# Patient Record
Sex: Female | Born: 1955 | Race: Black or African American | Hispanic: No | Marital: Single | Smoking: Smoker, current status unknown
Health system: Southern US, Community
[De-identification: ages and names within clinical notes are randomized; demographics above are authoritative.]

## PROBLEM LIST (undated history)

## (undated) DIAGNOSIS — D219 Benign neoplasm of connective and other soft tissue, unspecified: Secondary | ICD-10-CM

## (undated) DIAGNOSIS — J45909 Unspecified asthma, uncomplicated: Secondary | ICD-10-CM

## (undated) HISTORY — PX: ABDOMINAL HYSTERECTOMY: SHX81

## (undated) HISTORY — DX: Unspecified asthma, uncomplicated: J45.909

## (undated) HISTORY — DX: Benign neoplasm of connective and other soft tissue, unspecified: D21.9

---

## 2013-03-14 ENCOUNTER — Other Ambulatory Visit (HOSPITAL_COMMUNITY)
Admission: RE | Admit: 2013-03-14 | Discharge: 2013-03-14 | Disposition: A | Discharge status: Home / Self Care | Payer: PRIVATE HEALTH INSURANCE | Source: Ambulatory Visit | Attending: Family Medicine | Admitting: Family Medicine

## 2013-03-14 ENCOUNTER — Encounter: Payer: Self-pay | Admitting: Family Medicine

## 2013-03-14 ENCOUNTER — Ambulatory Visit (INDEPENDENT_AMBULATORY_CARE_PROVIDER_SITE_OTHER): Payer: PRIVATE HEALTH INSURANCE | Admitting: Family Medicine

## 2013-03-14 VITALS — BP 122/80 | HR 55 | Temp 98.4°F | Ht 63.75 in | Wt 150.6 lb

## 2013-03-14 DIAGNOSIS — R220 Localized swelling, mass and lump, head: Secondary | ICD-10-CM

## 2013-03-14 DIAGNOSIS — Z23 Encounter for immunization: Secondary | ICD-10-CM

## 2013-03-14 DIAGNOSIS — Z0000 Encounter for general adult medical examination without abnormal findings: Secondary | ICD-10-CM

## 2013-03-14 DIAGNOSIS — Z01419 Encounter for gynecological examination (general) (routine) without abnormal findings: Secondary | ICD-10-CM | POA: Insufficient documentation

## 2013-03-14 DIAGNOSIS — E2839 Other primary ovarian failure: Secondary | ICD-10-CM

## 2013-03-14 DIAGNOSIS — Z124 Encounter for screening for malignant neoplasm of cervix: Secondary | ICD-10-CM

## 2013-03-14 DIAGNOSIS — Z1239 Encounter for other screening for malignant neoplasm of breast: Secondary | ICD-10-CM

## 2013-03-14 LAB — CBC WITH DIFFERENTIAL/PLATELET
Basophils Relative: 0.8 % (ref 0.0–3.0)
Eosinophils Absolute: 0.1 10*3/uL (ref 0.0–0.7)
Eosinophils Relative: 1.5 % (ref 0.0–5.0)
HCT: 37.6 % (ref 36.0–46.0)
Lymphs Abs: 1.7 10*3/uL (ref 0.7–4.0)
MCHC: 33.4 g/dL (ref 30.0–36.0)
MCV: 85.5 fl (ref 78.0–100.0)
Monocytes Absolute: 0.3 10*3/uL (ref 0.1–1.0)
Neutrophils Relative %: 53.1 % (ref 43.0–77.0)
Platelets: 238 10*3/uL (ref 150.0–400.0)
RBC: 4.4 Mil/uL (ref 3.87–5.11)
WBC: 4.6 10*3/uL (ref 4.5–10.5)

## 2013-03-14 LAB — LIPID PANEL
Cholesterol: 142 mg/dL (ref 0–200)
HDL: 48.2 mg/dL
LDL Cholesterol: 84 mg/dL (ref 0–99)
Total CHOL/HDL Ratio: 3
Triglycerides: 47 mg/dL (ref 0.0–149.0)
VLDL: 9.4 mg/dL (ref 0.0–40.0)

## 2013-03-14 LAB — BASIC METABOLIC PANEL
BUN: 12 mg/dL (ref 6–23)
CO2: 26 mEq/L (ref 19–32)
Chloride: 107 mEq/L (ref 96–112)
Creatinine, Ser: 1 mg/dL (ref 0.4–1.2)
Potassium: 3.3 mEq/L — ABNORMAL LOW (ref 3.5–5.1)

## 2013-03-14 LAB — HEPATIC FUNCTION PANEL
ALT: 20 U/L (ref 0–35)
Total Protein: 7.1 g/dL (ref 6.0–8.3)

## 2013-03-14 LAB — TSH: TSH: 1.49 u[IU]/mL (ref 0.35–5.50)

## 2013-03-14 NOTE — Progress Notes (Signed)
Subjective:     Erica Hill is a 57 y.o. female and is here for a comprehensive physical exam. The patient reports no problems.  History   Social History  . Marital Status: Single    Spouse Name: N/A    Number of Children: N/A  . Years of Education: N/A   Occupational History  . [employer]    Social History Main Topics  . Smoking status: Smoker, Current Status Unknown -- 0.10 packs/day    Types: Cigarettes  . Smokeless tobacco: Never Used     Comment: 1 cig a day  . Alcohol Use: Yes     Comment: weekens  . Drug Use: No  . Sexually Active: Not Currently -- Female partner(s)   Other Topics Concern  . Not on file   Social History Narrative   Exercise-- stretching otherwise no   No health maintenance topics applied.  The following portions of the patient's history were reviewed and updated as appropriate:  She  has a past medical history of Asthma and Fibroids. She  does not have a problem list on file. She  has past surgical history that includes Abdominal hysterectomy. Her family history includes Cancer in her maternal uncle; Diabetes in her brother and mother; High blood pressure in her brother and mother; Stroke in her mother; and Tuberculosis in her father. She  reports that she has been smoking Cigarettes.  She has been smoking about 0.10 packs per day. She has never used smokeless tobacco. She reports that  drinks alcohol. She reports that she does not use illicit drugs. She currently has no medications in their medication list. No current outpatient prescriptions on file prior to visit.   No current facility-administered medications on file prior to visit.   She has No Known Allergies..  Review of Systems Review of Systems  Constitutional: Negative for activity change, appetite change and fatigue.  HENT: Negative for hearing loss, congestion, tinnitus and ear discharge.  dentist--no Eyes: Negative for visual disturbance (see optho--due) Respiratory:  Negative for cough, chest tightness and shortness of breath.   Cardiovascular: Negative for chest pain, palpitations and leg swelling.  Gastrointestinal: Negative for abdominal pain, diarrhea, constipation and abdominal distention.  Genitourinary: Negative for urgency, frequency, decreased urine volume and difficulty urinating.  Musculoskeletal: Negative for back pain, arthralgias and gait problem.  Skin: Negative for color change, pallor and rash.  Neurological: Negative for dizziness, light-headedness, numbness and headaches.  Hematological: Negative for adenopathy. Does not bruise/bleed easily.  Psychiatric/Behavioral: Negative for suicidal ideas, confusion, sleep disturbance, self-injury, dysphoric mood, decreased concentration and agitation.       Objective:    BP 122/80  Pulse 55  Temp(Src) 98.4 F (36.9 C) (Oral)  Ht 5' 3.75" (1.619 m)  Wt 150 lb 9.6 oz (68.312 kg)  BMI 26.06 kg/m2  SpO2 95% General appearance: alert, cooperative, appears stated age and no distress Head: Normocephalic, without obvious abnormality, atraumatic Eyes: negative findings: lids and lashes normal, conjunctivae and sclerae normal and pupils equal, round, reactive to light and accomodation Ears: normal TM's and external ear canals both ears Nose: Nares normal. Septum midline. Mucosa normal. No drainage or sinus tenderness. Throat: normal findings: lips normal without lesions, buccal mucosa normal and oropharynx pink & moist without lesions or evidence of thrush and abnormal findings: dentition: poor Neck: no adenopathy, no carotid bruit, no JVD, supple, symmetrical, trachea midline and thyroid not enlarged, symmetric, no tenderness/mass/nodules Back: symmetric, no curvature. ROM normal. No CVA tenderness. Lungs: clear  to auscultation bilaterally Breasts: normal appearance, no masses or tenderness Heart: regular rate and rhythm, S1, S2 normal, no murmur, click, rub or gallop Abdomen: soft, non-tender;  bowel sounds normal; no masses,  no organomegaly Pelvic: external genitalia normal, no adnexal masses or tenderness, rectovaginal septum normal, uterus surgically absent and vagina normal without discharge Extremities: extremities normal, atraumatic, no cyanosis or edema Pulses: 2+ and symmetric Skin: Skin color, texture, turgor normal. No rashes or lesions Lymph nodes: subclavicular mass L --- hard , nontender, nonmobile Neurologic: Alert and oriented X 3, normal strength and tone. Normal symmetric reflexes. Normal coordination and gait Psych-- no depression, no anxiety      Assessment:    Healthy female exam.      Plan:    ghm utd Check labs See After Visit Summary for Counseling Recommendations  Colon ordered Bmd, mammo ordered

## 2013-03-14 NOTE — Addendum Note (Signed)
Addended by: Arnette Norris on: 03/14/2013 02:14 PM   Modules accepted: Orders

## 2013-03-14 NOTE — Patient Instructions (Addendum)
Preventive Care for Adults, Female A healthy lifestyle and preventive care can promote health and wellness. Preventive health guidelines for women include the following key practices.  A routine yearly physical is a good way to check with your caregiver about your health and preventive screening. It is a chance to share any concerns and updates on your health, and to receive a thorough exam.  Visit your dentist for a routine exam and preventive care every 6 months. Brush your teeth twice a day and floss once a day. Good oral hygiene prevents tooth decay and gum disease.  The frequency of eye exams is based on your age, health, family medical history, use of contact lenses, and other factors. Follow your caregiver's recommendations for frequency of eye exams.  Eat a healthy diet. Foods like vegetables, fruits, whole grains, low-fat dairy products, and lean protein foods contain the nutrients you need without too many calories. Decrease your intake of foods high in solid fats, added sugars, and salt. Eat the right amount of calories for you.Get information about a proper diet from your caregiver, if necessary.  Regular physical exercise is one of the most important things you can do for your health. Most adults should get at least 150 minutes of moderate-intensity exercise (any activity that increases your heart rate and causes you to sweat) each week. In addition, most adults need muscle-strengthening exercises on 2 or more days a week.  Maintain a healthy weight. The body mass index (BMI) is a screening tool to identify possible weight problems. It provides an estimate of body fat based on height and weight. Your caregiver can help determine your BMI, and can help you achieve or maintain a healthy weight.For adults 20 years and older:  A BMI below 18.5 is considered underweight.  A BMI of 18.5 to 24.9 is normal.  A BMI of 25 to 29.9 is considered overweight.  A BMI of 30 and above is  considered obese.  Maintain normal blood lipids and cholesterol levels by exercising and minimizing your intake of saturated fat. Eat a balanced diet with plenty of fruit and vegetables. Blood tests for lipids and cholesterol should begin at age 20 and be repeated every 5 years. If your lipid or cholesterol levels are high, you are over 50, or you are at high risk for heart disease, you may need your cholesterol levels checked more frequently.Ongoing high lipid and cholesterol levels should be treated with medicines if diet and exercise are not effective.  If you smoke, find out from your caregiver how to quit. If you do not use tobacco, do not start.  If you are pregnant, do not drink alcohol. If you are breastfeeding, be very cautious about drinking alcohol. If you are not pregnant and choose to drink alcohol, do not exceed 1 drink per day. One drink is considered to be 12 ounces (355 mL) of beer, 5 ounces (148 mL) of wine, or 1.5 ounces (44 mL) of liquor.  Avoid use of street drugs. Do not share needles with anyone. Ask for help if you need support or instructions about stopping the use of drugs.  High blood pressure causes heart disease and increases the risk of stroke. Your blood pressure should be checked at least every 1 to 2 years. Ongoing high blood pressure should be treated with medicines if weight loss and exercise are not effective.  If you are 55 to 55 years old, ask your caregiver if you should take aspirin to prevent strokes.  Diabetes   screening involves taking a blood sample to check your fasting blood sugar level. This should be done once every 3 years, after age 45, if you are within normal weight and without risk factors for diabetes. Testing should be considered at a younger age or be carried out more frequently if you are overweight and have at least 1 risk factor for diabetes.  Breast cancer screening is essential preventive care for women. You should practice "breast  self-awareness." This means understanding the normal appearance and feel of your breasts and may include breast self-examination. Any changes detected, no matter how small, should be reported to a caregiver. Women in their 20s and 30s should have a clinical breast exam (CBE) by a caregiver as part of a regular health exam every 1 to 3 years. After age 40, women should have a CBE every year. Starting at age 40, women should consider having a mammography (breast X-ray test) every year. Women who have a family history of breast cancer should talk to their caregiver about genetic screening. Women at a high risk of breast cancer should talk to their caregivers about having magnetic resonance imaging (MRI) and a mammography every year.  The Pap test is a screening test for cervical cancer. A Pap test can show cell changes on the cervix that might become cervical cancer if left untreated. A Pap test is a procedure in which cells are obtained and examined from the lower end of the uterus (cervix).  Women should have a Pap test starting at age 21.  Between ages 21 and 29, Pap tests should be repeated every 2 years.  Beginning at age 30, you should have a Pap test every 3 years as long as the past 3 Pap tests have been normal.  Some women have medical problems that increase the chance of getting cervical cancer. Talk to your caregiver about these problems. It is especially important to talk to your caregiver if a new problem develops soon after your last Pap test. In these cases, your caregiver may recommend more frequent screening and Pap tests.  The above recommendations are the same for women who have or have not gotten the vaccine for human papillomavirus (HPV).  If you had a hysterectomy for a problem that was not cancer or a condition that could lead to cancer, then you no longer need Pap tests. Even if you no longer need a Pap test, a regular exam is a good idea to make sure no other problems are  starting.  If you are between ages 65 and 70, and you have had normal Pap tests going back 10 years, you no longer need Pap tests. Even if you no longer need a Pap test, a regular exam is a good idea to make sure no other problems are starting.  If you have had past treatment for cervical cancer or a condition that could lead to cancer, you need Pap tests and screening for cancer for at least 20 years after your treatment.  If Pap tests have been discontinued, risk factors (such as a new sexual partner) need to be reassessed to determine if screening should be resumed.  The HPV test is an additional test that may be used for cervical cancer screening. The HPV test looks for the virus that can cause the cell changes on the cervix. The cells collected during the Pap test can be tested for HPV. The HPV test could be used to screen women aged 30 years and older, and should   be used in women of any age who have unclear Pap test results. After the age of 30, women should have HPV testing at the same frequency as a Pap test.  Colorectal cancer can be detected and often prevented. Most routine colorectal cancer screening begins at the age of 50 and continues through age 75. However, your caregiver may recommend screening at an earlier age if you have risk factors for colon cancer. On a yearly basis, your caregiver may provide home test kits to check for hidden blood in the stool. Use of a small camera at the end of a tube, to directly examine the colon (sigmoidoscopy or colonoscopy), can detect the earliest forms of colorectal cancer. Talk to your caregiver about this at age 50, when routine screening begins. Direct examination of the colon should be repeated every 5 to 10 years through age 75, unless early forms of pre-cancerous polyps or small growths are found.  Hepatitis C blood testing is recommended for all people born from 1945 through 1965 and any individual with known risks for hepatitis C.  Practice  safe sex. Use condoms and avoid high-risk sexual practices to reduce the spread of sexually transmitted infections (STIs). STIs include gonorrhea, chlamydia, syphilis, trichomonas, herpes, HPV, and human immunodeficiency virus (HIV). Herpes, HIV, and HPV are viral illnesses that have no cure. They can result in disability, cancer, and death. Sexually active women aged 25 and younger should be checked for chlamydia. Older women with new or multiple partners should also be tested for chlamydia. Testing for other STIs is recommended if you are sexually active and at increased risk.  Osteoporosis is a disease in which the bones lose minerals and strength with aging. This can result in serious bone fractures. The risk of osteoporosis can be identified using a bone density scan. Women ages 65 and over and women at risk for fractures or osteoporosis should discuss screening with their caregivers. Ask your caregiver whether you should take a calcium supplement or vitamin D to reduce the rate of osteoporosis.  Menopause can be associated with physical symptoms and risks. Hormone replacement therapy is available to decrease symptoms and risks. You should talk to your caregiver about whether hormone replacement therapy is right for you.  Use sunscreen with sun protection factor (SPF) of 30 or more. Apply sunscreen liberally and repeatedly throughout the day. You should seek shade when your shadow is shorter than you. Protect yourself by wearing long sleeves, pants, a wide-brimmed hat, and sunglasses year round, whenever you are outdoors.  Once a month, do a whole body skin exam, using a mirror to look at the skin on your back. Notify your caregiver of new moles, moles that have irregular borders, moles that are larger than a pencil eraser, or moles that have changed in shape or color.  Stay current with required immunizations.  Influenza. You need a dose every fall (or winter). The composition of the flu vaccine  changes each year, so being vaccinated once is not enough.  Pneumococcal polysaccharide. You need 1 to 2 doses if you smoke cigarettes or if you have certain chronic medical conditions. You need 1 dose at age 65 (or older) if you have never been vaccinated.  Tetanus, diphtheria, pertussis (Tdap, Td). Get 1 dose of Tdap vaccine if you are younger than age 65, are over 65 and have contact with an infant, are a healthcare worker, are pregnant, or simply want to be protected from whooping cough. After that, you need a Td   booster dose every 10 years. Consult your caregiver if you have not had at least 3 tetanus and diphtheria-containing shots sometime in your life or have a deep or dirty wound.  HPV. You need this vaccine if you are a woman age 26 or younger. The vaccine is given in 3 doses over 6 months.  Measles, mumps, rubella (MMR). You need at least 1 dose of MMR if you were born in 1957 or later. You may also need a second dose.  Meningococcal. If you are age 19 to 21 and a first-year college student living in a residence hall, or have one of several medical conditions, you need to get vaccinated against meningococcal disease. You may also need additional booster doses.  Zoster (shingles). If you are age 60 or older, you should get this vaccine.  Varicella (chickenpox). If you have never had chickenpox or you were vaccinated but received only 1 dose, talk to your caregiver to find out if you need this vaccine.  Hepatitis A. You need this vaccine if you have a specific risk factor for hepatitis A virus infection or you simply wish to be protected from this disease. The vaccine is usually given as 2 doses, 6 to 18 months apart.  Hepatitis B. You need this vaccine if you have a specific risk factor for hepatitis B virus infection or you simply wish to be protected from this disease. The vaccine is given in 3 doses, usually over 6 months. Preventive Services / Frequency Ages 19 to 39  Blood  pressure check.** / Every 1 to 2 years.  Lipid and cholesterol check.** / Every 5 years beginning at age 20.  Clinical breast exam.** / Every 3 years for women in their 20s and 30s.  Pap test.** / Every 2 years from ages 21 through 29. Every 3 years starting at age 30 through age 65 or 70 with a history of 3 consecutive normal Pap tests.  HPV screening.** / Every 3 years from ages 30 through ages 65 to 70 with a history of 3 consecutive normal Pap tests.  Hepatitis C blood test.** / For any individual with known risks for hepatitis C.  Skin self-exam. / Monthly.  Influenza immunization.** / Every year.  Pneumococcal polysaccharide immunization.** / 1 to 2 doses if you smoke cigarettes or if you have certain chronic medical conditions.  Tetanus, diphtheria, pertussis (Tdap, Td) immunization. / A one-time dose of Tdap vaccine. After that, you need a Td booster dose every 10 years.  HPV immunization. / 3 doses over 6 months, if you are 26 and younger.  Measles, mumps, rubella (MMR) immunization. / You need at least 1 dose of MMR if you were born in 1957 or later. You may also need a second dose.  Meningococcal immunization. / 1 dose if you are age 19 to 21 and a first-year college student living in a residence hall, or have one of several medical conditions, you need to get vaccinated against meningococcal disease. You may also need additional booster doses.  Varicella immunization.** / Consult your caregiver.  Hepatitis A immunization.** / Consult your caregiver. 2 doses, 6 to 18 months apart.  Hepatitis B immunization.** / Consult your caregiver. 3 doses usually over 6 months. Ages 40 to 64  Blood pressure check.** / Every 1 to 2 years.  Lipid and cholesterol check.** / Every 5 years beginning at age 20.  Clinical breast exam.** / Every year after age 40.  Mammogram.** / Every year beginning at age 40   and continuing for as long as you are in good health. Consult with your  caregiver.  Pap test.** / Every 3 years starting at age 30 through age 65 or 70 with a history of 3 consecutive normal Pap tests.  HPV screening.** / Every 3 years from ages 30 through ages 65 to 70 with a history of 3 consecutive normal Pap tests.  Fecal occult blood test (FOBT) of stool. / Every year beginning at age 50 and continuing until age 75. You may not need to do this test if you get a colonoscopy every 10 years.  Flexible sigmoidoscopy or colonoscopy.** / Every 5 years for a flexible sigmoidoscopy or every 10 years for a colonoscopy beginning at age 50 and continuing until age 75.  Hepatitis C blood test.** / For all people born from 1945 through 1965 and any individual with known risks for hepatitis C.  Skin self-exam. / Monthly.  Influenza immunization.** / Every year.  Pneumococcal polysaccharide immunization.** / 1 to 2 doses if you smoke cigarettes or if you have certain chronic medical conditions.  Tetanus, diphtheria, pertussis (Tdap, Td) immunization.** / A one-time dose of Tdap vaccine. After that, you need a Td booster dose every 10 years.  Measles, mumps, rubella (MMR) immunization. / You need at least 1 dose of MMR if you were born in 1957 or later. You may also need a second dose.  Varicella immunization.** / Consult your caregiver.  Meningococcal immunization.** / Consult your caregiver.  Hepatitis A immunization.** / Consult your caregiver. 2 doses, 6 to 18 months apart.  Hepatitis B immunization.** / Consult your caregiver. 3 doses, usually over 6 months. Ages 65 and over  Blood pressure check.** / Every 1 to 2 years.  Lipid and cholesterol check.** / Every 5 years beginning at age 20.  Clinical breast exam.** / Every year after age 40.  Mammogram.** / Every year beginning at age 40 and continuing for as long as you are in good health. Consult with your caregiver.  Pap test.** / Every 3 years starting at age 30 through age 65 or 70 with a 3  consecutive normal Pap tests. Testing can be stopped between 65 and 70 with 3 consecutive normal Pap tests and no abnormal Pap or HPV tests in the past 10 years.  HPV screening.** / Every 3 years from ages 30 through ages 65 or 70 with a history of 3 consecutive normal Pap tests. Testing can be stopped between 65 and 70 with 3 consecutive normal Pap tests and no abnormal Pap or HPV tests in the past 10 years.  Fecal occult blood test (FOBT) of stool. / Every year beginning at age 50 and continuing until age 75. You may not need to do this test if you get a colonoscopy every 10 years.  Flexible sigmoidoscopy or colonoscopy.** / Every 5 years for a flexible sigmoidoscopy or every 10 years for a colonoscopy beginning at age 50 and continuing until age 75.  Hepatitis C blood test.** / For all people born from 1945 through 1965 and any individual with known risks for hepatitis C.  Osteoporosis screening.** / A one-time screening for women ages 65 and over and women at risk for fractures or osteoporosis.  Skin self-exam. / Monthly.  Influenza immunization.** / Every year.  Pneumococcal polysaccharide immunization.** / 1 dose at age 65 (or older) if you have never been vaccinated.  Tetanus, diphtheria, pertussis (Tdap, Td) immunization. / A one-time dose of Tdap vaccine if you are over   65 and have contact with an infant, are a healthcare worker, or simply want to be protected from whooping cough. After that, you need a Td booster dose every 10 years.  Varicella immunization.** / Consult your caregiver.  Meningococcal immunization.** / Consult your caregiver.  Hepatitis A immunization.** / Consult your caregiver. 2 doses, 6 to 18 months apart.  Hepatitis B immunization.** / Check with your caregiver. 3 doses, usually over 6 months. ** Family history and personal history of risk and conditions may change your caregiver's recommendations. Document Released: 12/20/2001 Document Revised: 01/16/2012  Document Reviewed: 03/21/2011 [employer] Patient Information 2013 [employer].  

## 2013-03-14 NOTE — Assessment & Plan Note (Signed)
US ordered

## 2013-03-18 ENCOUNTER — Ambulatory Visit (HOSPITAL_BASED_OUTPATIENT_CLINIC_OR_DEPARTMENT_OTHER)
Admission: RE | Admit: 2013-03-18 | Discharge: 2013-03-18 | Disposition: A | Discharge status: Home / Self Care | Payer: PRIVATE HEALTH INSURANCE | Source: Ambulatory Visit | Attending: Family Medicine | Admitting: Family Medicine

## 2013-03-18 DIAGNOSIS — Z1231 Encounter for screening mammogram for malignant neoplasm of breast: Secondary | ICD-10-CM | POA: Insufficient documentation

## 2013-03-18 DIAGNOSIS — Z1239 Encounter for other screening for malignant neoplasm of breast: Secondary | ICD-10-CM

## 2013-03-18 DIAGNOSIS — E041 Nontoxic single thyroid nodule: Secondary | ICD-10-CM | POA: Insufficient documentation

## 2013-03-18 DIAGNOSIS — R220 Localized swelling, mass and lump, head: Secondary | ICD-10-CM

## 2013-03-18 DIAGNOSIS — R928 Other abnormal and inconclusive findings on diagnostic imaging of breast: Secondary | ICD-10-CM | POA: Insufficient documentation

## 2013-03-19 ENCOUNTER — Other Ambulatory Visit: Payer: Self-pay | Admitting: Family Medicine

## 2013-03-19 DIAGNOSIS — R928 Other abnormal and inconclusive findings on diagnostic imaging of breast: Secondary | ICD-10-CM

## 2013-03-19 LAB — POCT URINALYSIS DIPSTICK
Bilirubin, UA: NEGATIVE
Blood, UA: NEGATIVE
Glucose, UA: NEGATIVE
Ketones, UA: NEGATIVE
Spec Grav, UA: 1.015
pH, UA: 6.5

## 2013-03-20 ENCOUNTER — Other Ambulatory Visit: Payer: Self-pay | Admitting: General Practice

## 2013-03-20 ENCOUNTER — Telehealth: Payer: Self-pay | Admitting: General Practice

## 2013-03-20 MED ORDER — METRONIDAZOLE 500 MG PO TABS: 500.0000 mg | ORAL_TABLET | Freq: Two times a day (BID) | ORAL | Status: DC

## 2013-03-20 NOTE — Telephone Encounter (Signed)
Called pt to give her results of [place]. While on the phone Pt stated that she has another blister on her behind. Said you wanted her to let you know. Please advise.

## 2013-03-20 NOTE — Telephone Encounter (Signed)
Patient scheduled for Friday at 1015    KP

## 2013-03-20 NOTE — Telephone Encounter (Signed)
I actually wanted to see it next time she broke out

## 2013-03-22 ENCOUNTER — Encounter: Payer: Self-pay | Admitting: Family Medicine

## 2013-03-22 ENCOUNTER — Ambulatory Visit (INDEPENDENT_AMBULATORY_CARE_PROVIDER_SITE_OTHER): Payer: PRIVATE HEALTH INSURANCE | Admitting: Family Medicine

## 2013-03-22 VITALS — BP 138/86 | HR 57 | Wt 153.0 lb

## 2013-03-22 DIAGNOSIS — K644 Residual hemorrhoidal skin tags: Secondary | ICD-10-CM

## 2013-03-22 MED ORDER — HYDROCORTISONE ACE-PRAMOXINE 1-1 % RE FOAM: 1.0000 | Freq: Two times a day (BID) | RECTAL | Status: DC

## 2013-03-22 NOTE — Progress Notes (Signed)
  Subjective:    Patient ID: Erica Hill, female    DOB: 1956/08/04, 57 y.o.   MRN: [id number]  HPI Pt here c/o blister on rectum that developed since last visit.  No constipation, no diarrhea.  No long trips. No other symptoms   Review of Systems As above     Objective:   Physical Exam  BP 138/86  Pulse 57  Wt 153 lb (69.4 kg)  BMI 26.48 kg/m2  SpO2 97% General appearance: alert, cooperative, appears stated age and no distress Rectum-- + ext hemorrhoid , no bleeding      Assessment & Plan:

## 2013-03-22 NOTE — Assessment & Plan Note (Signed)
Proctofoam Sitz bath To GI if no improvement

## 2013-03-22 NOTE — Patient Instructions (Signed)

## 2013-03-29 ENCOUNTER — Other Ambulatory Visit: Payer: PRIVATE HEALTH INSURANCE

## 2013-04-11 ENCOUNTER — Ambulatory Visit
Admission: RE | Admit: 2013-04-11 | Discharge: 2013-04-11 | Disposition: A | Discharge status: Home / Self Care | Payer: PRIVATE HEALTH INSURANCE | Source: Ambulatory Visit | Attending: Family Medicine | Admitting: Family Medicine

## 2013-04-11 ENCOUNTER — Other Ambulatory Visit: Payer: Self-pay | Admitting: Family Medicine

## 2013-04-11 DIAGNOSIS — R928 Other abnormal and inconclusive findings on diagnostic imaging of breast: Secondary | ICD-10-CM

## 2013-04-15 ENCOUNTER — Ambulatory Visit
Admission: RE | Admit: 2013-04-15 | Discharge: 2013-04-15 | Disposition: A | Discharge status: Home / Self Care | Payer: PRIVATE HEALTH INSURANCE | Source: Ambulatory Visit | Attending: Family Medicine | Admitting: Family Medicine

## 2013-04-15 ENCOUNTER — Other Ambulatory Visit: Payer: Self-pay | Admitting: Family Medicine

## 2013-04-15 DIAGNOSIS — R928 Other abnormal and inconclusive findings on diagnostic imaging of breast: Secondary | ICD-10-CM

## 2013-04-15 DIAGNOSIS — E2839 Other primary ovarian failure: Secondary | ICD-10-CM

## 2013-04-25 ENCOUNTER — Inpatient Hospital Stay: Admission: RE | Admit: 2013-04-25 | Payer: PRIVATE HEALTH INSURANCE | Source: Ambulatory Visit

## 2013-04-30 ENCOUNTER — Inpatient Hospital Stay: Admission: RE | Admit: 2013-04-30 | Payer: PRIVATE HEALTH INSURANCE | Source: Ambulatory Visit

## 2013-08-07 ENCOUNTER — Encounter: Payer: Self-pay | Admitting: Family Medicine

## 2013-08-07 ENCOUNTER — Ambulatory Visit (INDEPENDENT_AMBULATORY_CARE_PROVIDER_SITE_OTHER): Payer: PRIVATE HEALTH INSURANCE | Admitting: Family Medicine

## 2013-08-07 VITALS — BP 120/80 | HR 58 | Temp 98.2°F | Wt 153.2 lb

## 2013-08-07 DIAGNOSIS — B86 Scabies: Secondary | ICD-10-CM

## 2013-08-07 MED ORDER — LINDANE 1 % EX LOTN: TOPICAL_LOTION | Freq: Once | CUTANEOUS | Status: DC

## 2013-08-07 MED ORDER — METHYLPREDNISOLONE ACETATE 80 MG/ML IJ SUSP
80.0000 mg | Freq: Once | INTRAMUSCULAR | Status: AC
  Administered 2013-08-07: 80 mg via INTRAMUSCULAR

## 2013-08-07 NOTE — Patient Instructions (Addendum)
Scabies  Scabies are small bugs (mites) that burrow under the skin and cause red bumps and severe itching. These bugs can only be seen with a microscope. Scabies are highly contagious. They can spread easily from person to person by direct contact. They are also spread through sharing clothing or linens that have the scabies mites living in them. It is not unusual for an entire family to become infected through shared towels, clothing, or bedding.   HOME CARE INSTRUCTIONS   · Your caregiver may prescribe a cream or lotion to kill the mites. If cream is prescribed, massage the cream into the entire body from the neck to the bottom of both feet. Also massage the cream into the scalp and face if your child is less than 1 year old. Avoid the eyes and mouth. Do not wash your hands after application.  · Leave the cream on for 8 to 12 hours. Your child should bathe or shower after the 8 to 12 hour application period. Sometimes it is helpful to apply the cream to your child right before bedtime.  · One treatment is usually effective and will eliminate approximately 95% of infestations. For severe cases, your caregiver may decide to repeat the treatment in 1 week. Everyone in your household should be treated with one application of the cream.  · New rashes or burrows should not appear within 24 to 48 hours after successful treatment. However, the itching and rash may last for 2 to 4 weeks after successful treatment. Your caregiver may prescribe a medicine to help with the itching or to help the rash go away more quickly.  · Scabies can live on clothing or linens for up to 3 days. All of your child's recently used clothing, towels, stuffed toys, and bed linens should be washed in hot water and then dried in a dryer for at least 20 minutes on high heat. Items that cannot be washed should be enclosed in a plastic bag for at least 3 days.  · To help relieve itching, bathe your child in a cool bath or apply cool washcloths to the  affected areas.  · Your child may return to school after treatment with the prescribed cream.  SEEK MEDICAL CARE IF:   · The itching persists longer than 4 weeks after treatment.  · The rash spreads or becomes infected. Signs of infection include red blisters or yellow-tan crust.  Document Released: 10/24/2005 Document Revised: 01/16/2012 Document Reviewed: 03/04/2009  [employer]® Patient Information ©2014 [employer].

## 2013-08-07 NOTE — Addendum Note (Signed)
Addended by: Arnette Norris on: 08/07/2013 10:05 AM   Modules accepted: Orders

## 2013-08-07 NOTE — Progress Notes (Signed)
  Subjective:     Erica Hill is a 57 y.o. female who presents for evaluation of a rash involving the forearm and leg. Rash started 2 months ago. Lesions are pink, and raised in texture. Rash has changed over time. Rash is pruritic. Associated symptoms: none. Patient denies: abdominal pain, arthralgia, congestion, cough, crankiness, decrease in appetite, decrease in energy level, fever, headache, irritability, myalgia, nausea, sore throat and vomiting. Patient has not had contacts with similar rash. Patient has not had new exposures (soaps, lotions, laundry detergents, foods, medications, plants, insects or animals).  The following portions of the patient's history were reviewed and updated as appropriate: allergies, current medications, past family history, past medical history, past social history, past surgical history and problem list.  Review of Systems Pertinent items are noted in HPI.    Objective:    BP 120/80  Pulse 58  Temp(Src) 98.2 F (36.8 C) (Oral)  Wt 153 lb 3.2 oz (69.491 kg)  BMI 26.51 kg/m2  SpO2 97% General:  alert, cooperative, appears stated age and no distress  Skin:  papules noted on extremities-- very irritated and itchy, escoriations     Assessment:    scabies    Plan:    Medications: lindane. Written patient instruction given. Follow up in a few days. --prn

## 2013-08-12 ENCOUNTER — Telehealth: Payer: Self-pay | Admitting: *Deleted

## 2013-08-12 NOTE — Telephone Encounter (Signed)
Pt made aware of recommendations and schedule an appointment

## 2013-08-12 NOTE — Telephone Encounter (Signed)
Pt called and stated that she was prescribed lindane lotion for scabies and she believes it is not working. Pt also states that she doesn't believe that she scabies. Pt is believes that the medication is making it worse than it was before. Pt would like to know what she needs to do or she will be going to another provider for answers. Please advise.  SW, CMA

## 2013-08-12 NOTE — Telephone Encounter (Signed)
If it has gotten worse we can take another look and given injection and steroid taper if needed

## 2013-08-15 ENCOUNTER — Ambulatory Visit (INDEPENDENT_AMBULATORY_CARE_PROVIDER_SITE_OTHER): Payer: PRIVATE HEALTH INSURANCE | Admitting: Family Medicine

## 2013-08-15 ENCOUNTER — Encounter: Payer: Self-pay | Admitting: Family Medicine

## 2013-08-15 ENCOUNTER — Telehealth: Payer: Self-pay | Admitting: Family Medicine

## 2013-08-15 VITALS — BP 154/82 | HR 59 | Temp 98.2°F | Wt 151.0 lb

## 2013-08-15 DIAGNOSIS — R21 Rash and other nonspecific skin eruption: Secondary | ICD-10-CM

## 2013-08-15 DIAGNOSIS — L0231 Cutaneous abscess of buttock: Secondary | ICD-10-CM

## 2013-08-15 DIAGNOSIS — L0591 Pilonidal cyst without abscess: Secondary | ICD-10-CM | POA: Insufficient documentation

## 2013-08-15 MED ORDER — PREDNISONE 10 MG PO TABS: ORAL_TABLET | ORAL | Status: AC

## 2013-08-15 MED ORDER — METHYLPREDNISOLONE ACETATE 80 MG/ML IJ SUSP
80.0000 mg | Freq: Once | INTRAMUSCULAR | Status: AC
  Administered 2013-08-15: 80 mg via INTRAMUSCULAR

## 2013-08-15 MED ORDER — AMOXICILLIN-POT CLAVULANATE 875-125 MG PO TABS: 1.0000 | ORAL_TABLET | Freq: Two times a day (BID) | ORAL | Status: AC

## 2013-08-15 NOTE — Telephone Encounter (Signed)
Patients refusal to pay co-pay was brought to my attention and provider made aware. Patient was seen in the office earlier this month for a "rash" that was determined to be scabies at which time she was given a prescription to treat that. Patient states that rash was a result of this medication when in fact she had a rash/scabies when she presented to the office at that time.

## 2013-08-15 NOTE — Telephone Encounter (Signed)
08/15/2013  Pt refused to pay her co-pay for her appt today.  She states she was told she has scabies, but says she does not have scabies, that the medication is what broke her skin out.  She states the meds she was given at her last visit "broke her out" and the problem is ongoing.  bw

## 2013-08-15 NOTE — Progress Notes (Signed)
  Subjective:     Erica Hill is a 57 y.o. female who presents for evaluation of a rash involving the lower extremity, upper extremity and sore on buttocks as well. Rash started several weeks ago. Lesions are pink, and raised in texture. Rash has changed over time. Rash is pruritic. Associated symptoms: none. Patient denies: abdominal pain, arthralgia, congestion, cough, crankiness, decrease in appetite, decrease in energy level, fever, headache, irritability, myalgia, nausea, sore throat and vomiting. Patient has not had contacts with similar rash. Patient has not had new exposures (soaps, lotions, laundry detergents, foods, medications, plants, insects or animals).  The following portions of the patient's history were reviewed and updated as appropriate: allergies, current medications, past family history, past medical history, past social history, past surgical history and problem list.  Review of Systems Pertinent items are noted in HPI.    Objective:    BP 154/82  Pulse 59  Temp(Src) 98.2 F (36.8 C) (Oral)  Wt 151 lb (68.493 kg)  BMI 26.13 kg/m2  SpO2 97% General:  alert, cooperative, appears stated age and no distress  Skin:  + blistering rash both wrists and knees,  + cyst on low back that is painful   I &D-- betadine used to clean area , 1cc xylocaine used to anesthetize area and scalpel used to drain cyst--- clear/ yellow fluid expressed.  No complications  Assessment:    rash--? etiology    Plan:    Medications: steroids: depo medrol and pred taper and abx. Written patient instruction given. Follow up in several days.  -prn

## 2013-08-15 NOTE — Assessment & Plan Note (Signed)
Drained with no complications Pt placed on abx rto prn

## 2013-08-18 LAB — WOUND CULTURE: Organism ID, Bacteria: NO GROWTH

## 2013-08-23 ENCOUNTER — Other Ambulatory Visit: Payer: Self-pay | Admitting: Family Medicine

## 2013-08-23 ENCOUNTER — Telehealth: Payer: Self-pay | Admitting: *Deleted

## 2013-08-23 DIAGNOSIS — L0591 Pilonidal cyst without abscess: Secondary | ICD-10-CM

## 2013-08-23 NOTE — Telephone Encounter (Signed)
Spoke with pt after she called the office with complaints of the area that was drained "still itching bad." Patient stated that area is still draining at times but that the drainage is clear in color. Patient has 1 day left of abx, do you want to see her again to assess area?

## 2013-08-23 NOTE — Telephone Encounter (Signed)
Patient has been scheduled for today at [facility] with Royden Purl at 1:30. Patient was given the appointment information and the address as well as phone numer, she stated she would give them a call for directions.      KP

## 2013-08-23 NOTE — Telephone Encounter (Signed)
Refer to derm  And we can see her if she would like

## 2013-11-08 IMAGING — US US RT BREAST BX W LOC DEV 1ST LESION IMG BX SPEC US GUIDE
1 series · 10 of 10 positions shown · non-contrast
Comparison: none

***ADDENDUM*** CREATED: 04/17/2013 [DATE]

Pathology revealed benign breast tissue with parenchymal
hyalinization in the right breast. This was found to be concordant
by Dr. Nafeh Abele. Pathology was relayed to the patient by
telephone. The patient reported doing well after the biopsy with
minimal tenderness. Post biopsy instructions were reviewed and her
questions were answered. She was encouraged to call [facility]
asked to return in 1 year for screening mammography.
Pathology results are dictated by Hemali Giuliani RN, BSN on April 17, 2013.
***END ADDENDUM*** SIGNED BY: Yong Nevels, M.D.
CLINICAL DATA: 7 x 5 x 7 mm oval nodule at 7 o'clock 5 cm from the
right nipple.

[Series 2: us right breast bx w loc dev 1st lesion img bx spe · 10 of 10 slices shown]
[im 1/10]
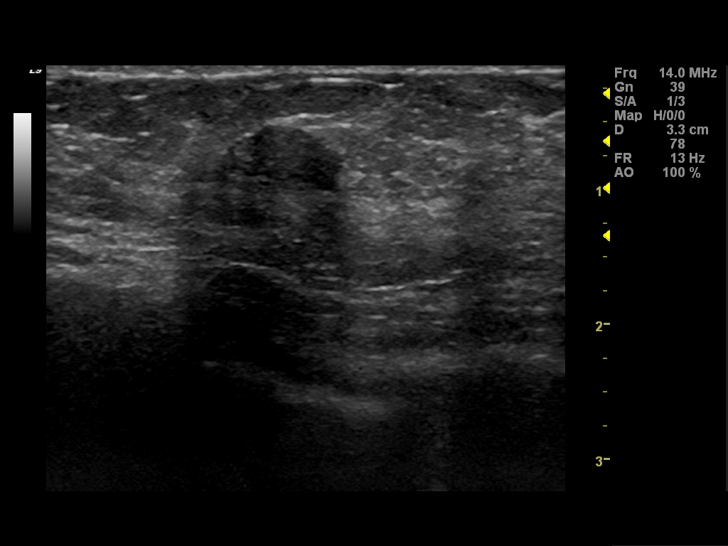
[im 2/10]
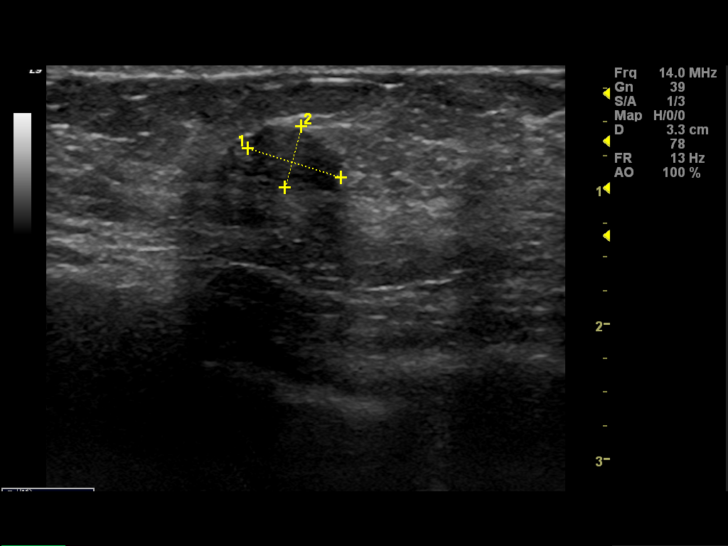
[im 3/10]
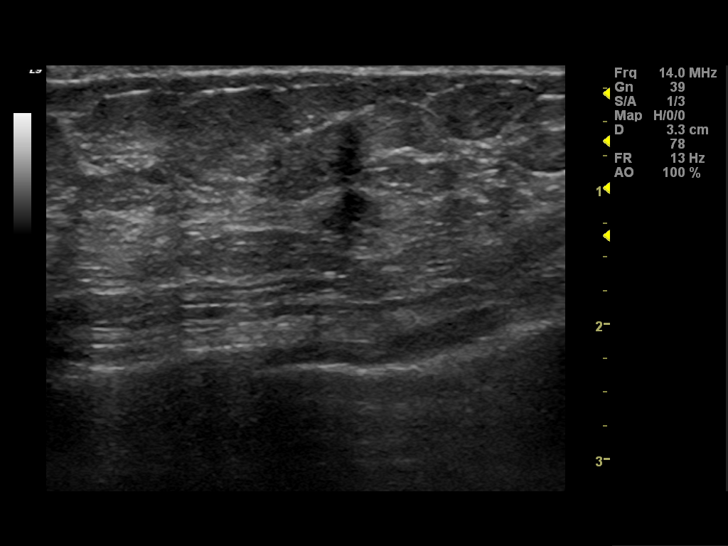
[im 4/10]
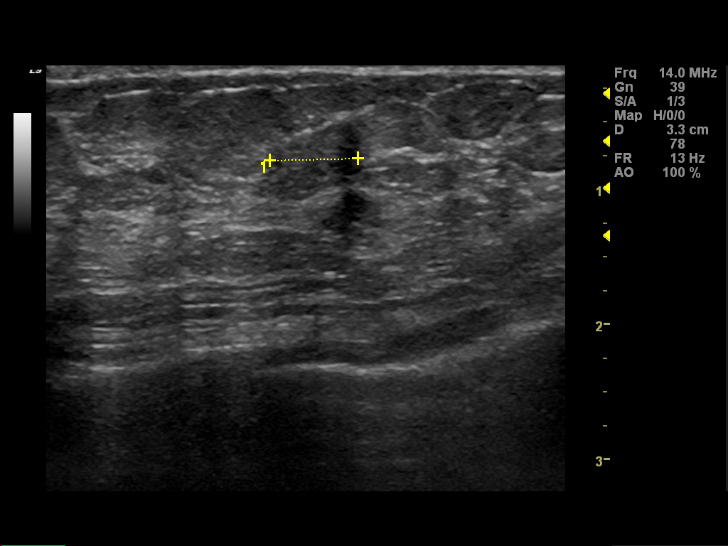
[im 5/10]
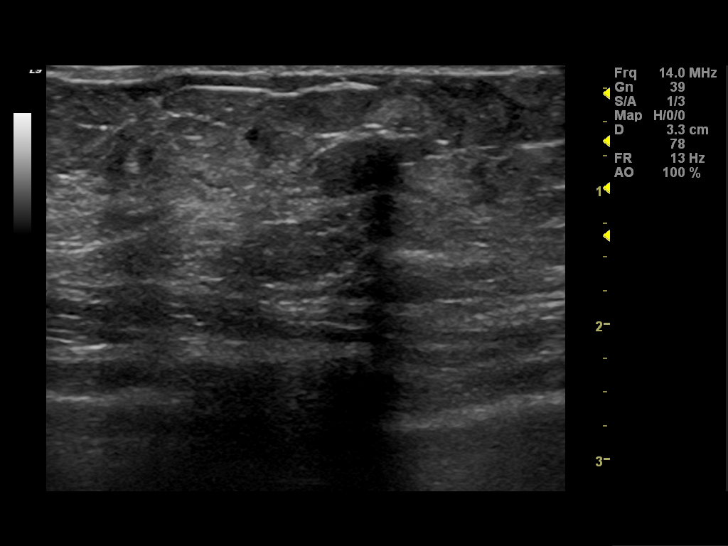
[im 6/10]
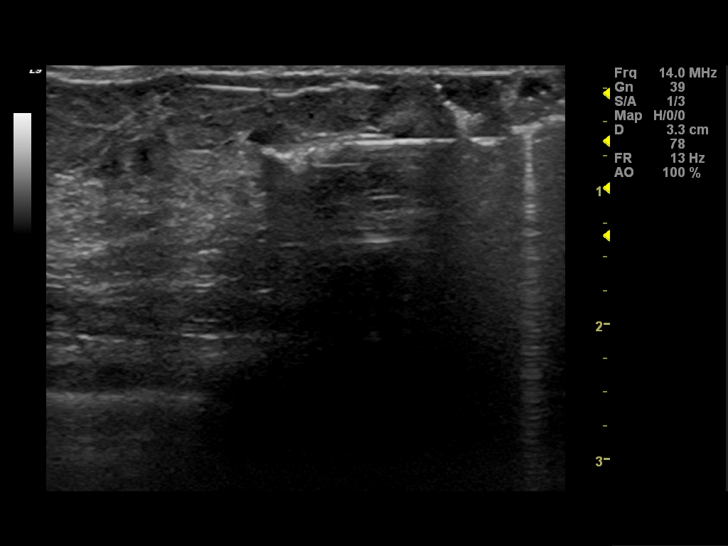
[im 7/10]
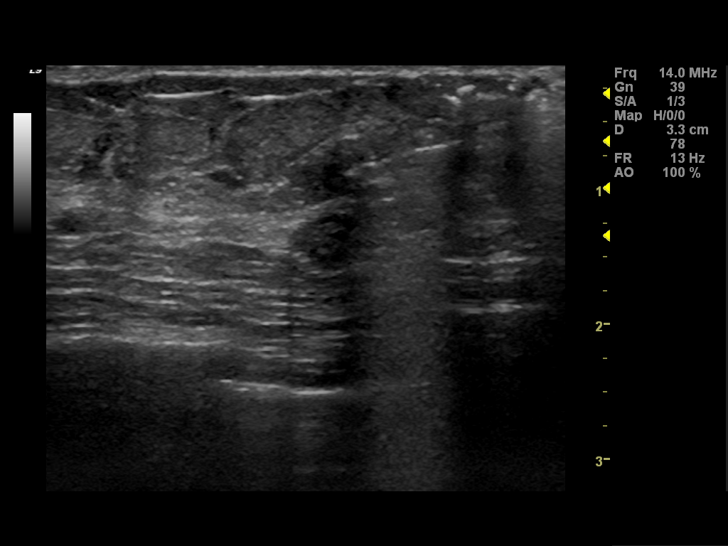
[im 8/10]
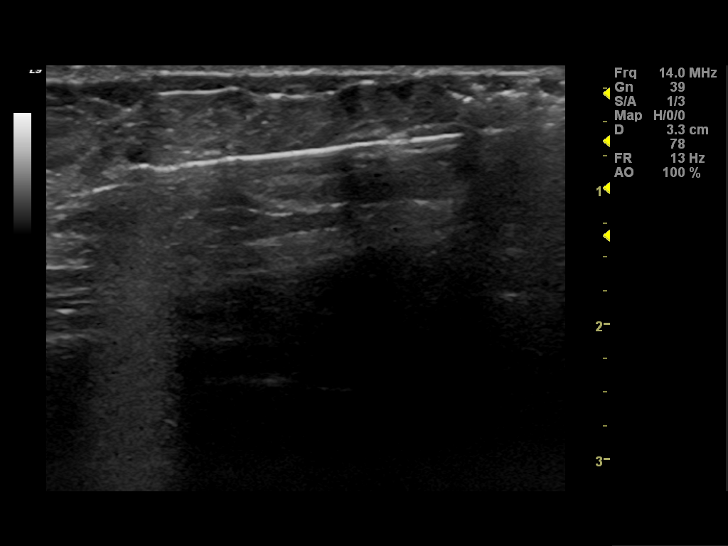
[im 9/10]
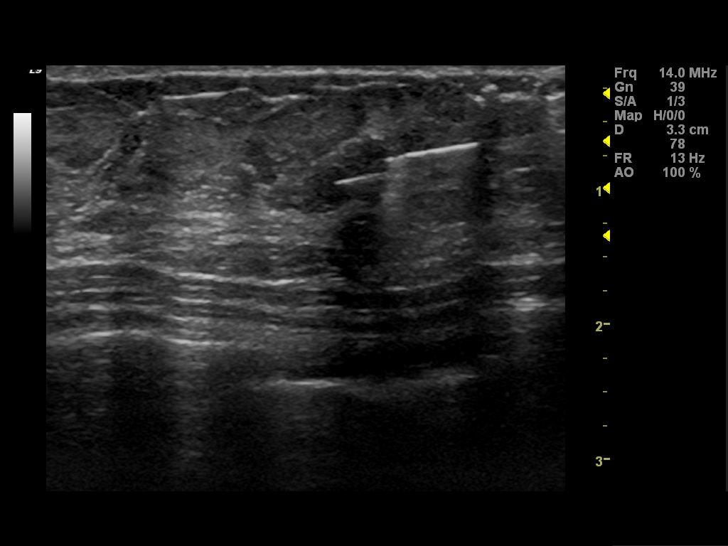
[im 10/10]
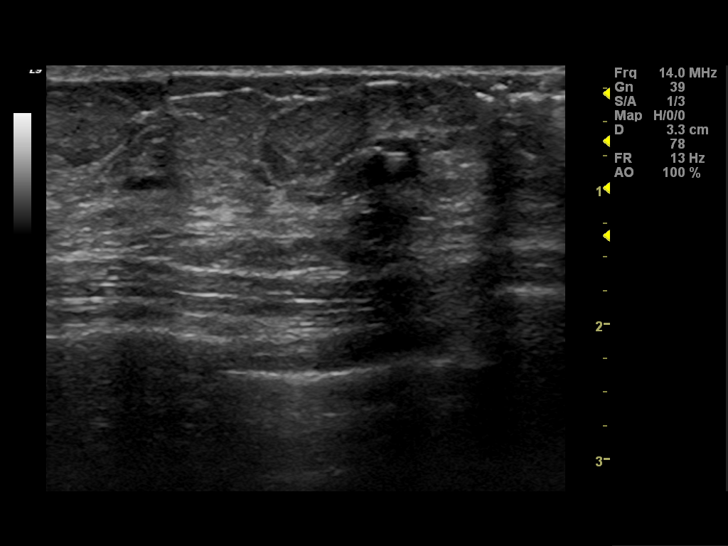

[10 of 10 positions shown; findings below may reference images not displayed]

ULTRASOUND GUIDED VACUUM ASSISTED CORE BIOPSY OF THE RIGHT BREAST

The patient and I discussed the procedure of ultrasound-guided
biopsy, including benefits and alternatives.  We discussed the high
likelihood of a successful procedure. We discussed the risks of the
procedure including infection, bleeding, tissue injury, clip
migration, and inadequate sampling.  Written informed consent was
given. Appropriate time-out was performed.

Using sterile technique, 2% lidocaine, ultrasound guidance, and a
12 gauge vacuum assisted needle, biopsy was performed of the oval
nodule at 7 o'clock 5 cm from the right nipple using a caudocranial
approach.  At the conclusion of the procedure, a ribbon tissue
marker clip was deployed into the biopsy cavity.  Follow-up 2-view
mammogram was performed and dictated separately.
IMPRESSION: Ultrasound-guided biopsy of an oval nodule at 7 o'clock 5 cm from
the right nipple.  No apparent complications.
